# Patient Record
Sex: Male | Born: 1945 | Race: White | Hispanic: No | Marital: Married
Health system: Southern US, Community
[De-identification: ages and names within clinical notes are randomized; demographics above are authoritative.]

## PROBLEM LIST (undated history)

## (undated) DIAGNOSIS — J9621 Acute and chronic respiratory failure with hypoxia: Secondary | ICD-10-CM

## (undated) DIAGNOSIS — J449 Chronic obstructive pulmonary disease, unspecified: Secondary | ICD-10-CM

## (undated) DIAGNOSIS — B20 Human immunodeficiency virus [HIV] disease: Secondary | ICD-10-CM

## (undated) DIAGNOSIS — E785 Hyperlipidemia, unspecified: Secondary | ICD-10-CM

## (undated) DIAGNOSIS — B59 Pneumocystosis: Secondary | ICD-10-CM

## (undated) HISTORY — PX: CATARACT EXTRACTION: SUR2

## (undated) HISTORY — PX: BRONCHOSCOPY: SUR163

## (undated) HISTORY — PX: FOOT SURGERY: SHX648

---

## 2018-05-21 ENCOUNTER — Inpatient Hospital Stay
Admission: AD | Admit: 2018-05-21 | Discharge: 2018-06-23 | Disposition: E | Payer: Medicare Other | Source: Ambulatory Visit | Attending: Internal Medicine | Admitting: Internal Medicine

## 2018-05-21 ENCOUNTER — Other Ambulatory Visit (HOSPITAL_COMMUNITY): Payer: Medicare Other

## 2018-05-21 DIAGNOSIS — J4489 Other specified chronic obstructive pulmonary disease: Secondary | ICD-10-CM | POA: Diagnosis present

## 2018-05-21 DIAGNOSIS — B20 Human immunodeficiency virus [HIV] disease: Secondary | ICD-10-CM | POA: Diagnosis present

## 2018-05-21 DIAGNOSIS — J9621 Acute and chronic respiratory failure with hypoxia: Secondary | ICD-10-CM | POA: Diagnosis present

## 2018-05-21 DIAGNOSIS — Z4659 Encounter for fitting and adjustment of other gastrointestinal appliance and device: Secondary | ICD-10-CM

## 2018-05-21 DIAGNOSIS — J449 Chronic obstructive pulmonary disease, unspecified: Secondary | ICD-10-CM | POA: Diagnosis present

## 2018-05-21 DIAGNOSIS — J9690 Respiratory failure, unspecified, unspecified whether with hypoxia or hypercapnia: Secondary | ICD-10-CM

## 2018-05-21 DIAGNOSIS — Z0189 Encounter for other specified special examinations: Secondary | ICD-10-CM

## 2018-05-21 DIAGNOSIS — B59 Pneumocystosis: Secondary | ICD-10-CM | POA: Diagnosis present

## 2018-05-21 HISTORY — DX: Pneumocystosis: B59

## 2018-05-21 HISTORY — DX: Acute and chronic respiratory failure with hypoxia: J96.21

## 2018-05-21 HISTORY — DX: Chronic obstructive pulmonary disease, unspecified: J44.9

## 2018-05-21 HISTORY — DX: Hyperlipidemia, unspecified: E78.5

## 2018-05-21 HISTORY — DX: Human immunodeficiency virus (HIV) disease: B20

## 2018-05-22 ENCOUNTER — Institutional Professional Consult (permissible substitution) (HOSPITAL_COMMUNITY): Payer: Medicare Other

## 2018-05-22 DIAGNOSIS — J9621 Acute and chronic respiratory failure with hypoxia: Secondary | ICD-10-CM

## 2018-05-22 DIAGNOSIS — J449 Chronic obstructive pulmonary disease, unspecified: Secondary | ICD-10-CM

## 2018-05-22 DIAGNOSIS — B20 Human immunodeficiency virus [HIV] disease: Secondary | ICD-10-CM

## 2018-05-22 DIAGNOSIS — B59 Pneumocystosis: Secondary | ICD-10-CM | POA: Diagnosis not present

## 2018-05-22 LAB — CBC
HEMATOCRIT: 34.2 % — AB (ref 39.0–52.0)
HEMOGLOBIN: 10.6 g/dL — AB (ref 13.0–17.0)
MCH: 28.2 pg (ref 26.0–34.0)
MCHC: 31 g/dL (ref 30.0–36.0)
MCV: 91 fL (ref 78.0–100.0)
PLATELETS: 59 10*3/uL — AB (ref 150–400)
RBC: 3.76 MIL/uL — ABNORMAL LOW (ref 4.22–5.81)
RDW: 16.7 % — ABNORMAL HIGH (ref 11.5–15.5)
WBC: 19.1 10*3/uL — ABNORMAL HIGH (ref 4.0–10.5)

## 2018-05-22 LAB — COMPREHENSIVE METABOLIC PANEL
ALBUMIN: 1.9 g/dL — AB (ref 3.5–5.0)
ALT: 50 U/L — ABNORMAL HIGH (ref 0–44)
ANION GAP: 7 (ref 5–15)
AST: 62 U/L — AB (ref 15–41)
Alkaline Phosphatase: 159 U/L — ABNORMAL HIGH (ref 38–126)
BILIRUBIN TOTAL: 0.6 mg/dL (ref 0.3–1.2)
BUN: 34 mg/dL — AB (ref 8–23)
CALCIUM: 7.4 mg/dL — AB (ref 8.9–10.3)
CO2: 27 mmol/L (ref 22–32)
Chloride: 102 mmol/L (ref 98–111)
Creatinine, Ser: 1.2 mg/dL (ref 0.61–1.24)
GFR calc Af Amer: >60 mL/min (ref >60)
GFR calc non Af Amer: 60 mL/min — ABNORMAL LOW (ref >60)
GLUCOSE: 231 mg/dL — AB (ref 70–99)
POTASSIUM: 5.3 mmol/L — AB (ref 3.5–5.1)
SODIUM: 136 mmol/L (ref 135–145)
TOTAL PROTEIN: 5.5 g/dL — AB (ref 6.5–8.1)

## 2018-05-22 LAB — PROTIME-INR
INR: 1.35
Prothrombin Time: 16.6 seconds — ABNORMAL HIGH (ref 11.4–15.2)

## 2018-05-22 LAB — LACTIC ACID, PLASMA
LACTIC ACID, VENOUS: 3 mmol/L — AB (ref 0.5–1.9)
Lactic Acid, Venous: 3.8 mmol/L (ref 0.5–1.9)

## 2018-05-22 LAB — PHOSPHORUS: PHOSPHORUS: 3.4 mg/dL (ref 2.5–4.6)

## 2018-05-22 LAB — MAGNESIUM: MAGNESIUM: 2.1 mg/dL (ref 1.7–2.4)

## 2018-05-22 NOTE — Consult Note (Signed)
[facility] [place]  [facility]  Date of Service: 05/22/2018  PULMONARY CRITICAL CARE CONSULT   Walter Sanchez  NWG:[id number]  DOB: October 15, 1945   DOA: 10/30/17  Referring Physician: Carron CurieAli Hijazi, MD  HPI: Walter Sanchez is a 72 y.o. male seen for follow up of Acute on Chronic Respiratory Failure.  Patient presented originally to the hospital with history of COPD chronic anemia.  Patient had presented with hemoglobin of 5.5.  Apparently has a history of COPD chronic oxygen dependence and he was seen initially for the low hemoglobin for possibility of EGD or colonoscopy.  In addition he had been having poor appetite and issues with his potassium.  His hospital course was complicated as noted.  The patient initially was admitted with a diffusely abnormal CT scan and there was concern raised whether this might be tuberculosis versus malignancy.  Patient was admitted to isolation room and sputum's were ordered.  Patient had decompensation with increased respiratory distress was intubated on 26 August.  The patient underwent a bronchoscopy and cultures were sent to include PCP fungal BAL as well as CMV and AFB.  Patient was found apparently to have a low CD4 count of 110 his viral load was 646,000 and a positive HIV status.  Patient was transferred to our facility for further management and work-up.  At the time of evaluation he is significantly hypoxic and is on the ventilator.  Patient has diffuse infiltrates noted on the chest film.  Patient is critically ill.  The results of the PCP which was done on August 26 have come back as positive and patient right now is being treated aggressively for pneumocystis.  Review of Systems:  ROS performed and is unremarkable other than noted above.  Past Medical History:  Diagnosis Date  . Acute on chronic respiratory failure with hypoxia (HCC)   . AIDS (acquired immune deficiency syndrome) (HCC)   . HLD  (hyperlipidemia)   . Obstructive chronic bronchitis without exacerbation (HCC)   . PCP (pneumocystis jiroveci pneumonia) (HCC)     Past Surgical History:  Procedure Laterality Date  . BRONCHOSCOPY    . CATARACT EXTRACTION    . FOOT SURGERY      Social History:    has an unknown smoking status. He has never used smokeless tobacco. He reports that he drank alcohol. He reports that he has current or past drug history. Drug: IV.  Family History: Non-Contributory to the present illness  Allergies have been reviewed on the paper chart  Medications: Reviewed on Rounds  Physical Exam:  Vitals: Temperature 98.3 pulse 86 respiratory rate 20 blood pressure 116/70 saturation 94%  Ventilator Settings mode of ventilation assist control FiO2 80% tidal volume 400 PEEP 10  . General: Comfortable at this time . Eyes: Grossly normal lids, irises & conjunctiva . ENT: grossly tongue is normal . Neck: no obvious mass . Cardiovascular: S1-S2 normal no gallop or rub . Respiratory: Few distant rhonchi are noted at this time . Abdomen: Soft nontender . Skin: no rash seen on limited exam . Musculoskeletal: not rigid . Psychiatric:unable to assess . Neurologic: no seizure no involuntary movements         Labs on Admission:  Basic Metabolic Panel: Recent Labs  Lab 05/22/18 0501  NA 136  K 5.3*  CL 102  CO2 27  GLUCOSE 231*  BUN 34*  CREATININE 1.20  CALCIUM 7.4*  MG 2.1  PHOS 3.4    Liver Function Tests: Recent Labs  Lab 05/22/18 0501  AST 62*  ALT 50*  ALKPHOS 159*  BILITOT 0.6  PROT 5.5*  ALBUMIN 1.9*   No results for input(s): LIPASE, AMYLASE in the last 168 hours. No results for input(s): AMMONIA in the last 168 hours.  CBC: Recent Labs  Lab 05/22/18 0501  WBC 19.1*  HGB 10.6*  HCT 34.2*  MCV 91.0  PLT 59*    Cardiac Enzymes: No results for input(s): CKTOTAL, CKMB, CKMBINDEX, TROPONINI in the last 168 hours.  BNP (last 3 results) No results for  input(s): BNP in the last 8760 hours.  ProBNP (last 3 results) No results for input(s): PROBNP in the last 8760 hours.   Radiological Exams on Admission: Dg Chest Port 1 View  Result Date: 06/03/18 CLINICAL DATA:  Respiratory failure. EXAM: PORTABLE CHEST 1 VIEW COMPARISON:  None. FINDINGS: An endotracheal tube is identified with tip 6 cm above the carina. An NG tube enters the stomach with tip off the field of view. A LEFT subclavian central venous catheter is noted with tip overlying the mid SVC. Diffuse bilateral airspace opacities are present. There may be trace bilateral pleural effusions. No acute bony abnormalities are identified. IMPRESSION: Support apparatus as described. Endotracheal tube with tip 6 cm above the carina. Diffuse bilateral airspace opacities, favor edema over infection. Question trace pleural effusions. Electronically Signed   By: Harmon Pier M.D.   On: 06-03-18 21:01    Assessment/Plan Active Problems:   Obstructive chronic bronchitis without exacerbation (HCC)   Acute on chronic respiratory failure with hypoxia (HCC)   PCP (pneumocystis jiroveci pneumonia) (HCC)   AIDS (acquired immune deficiency syndrome) (HCC)   1. Acute on chronic respiratory failure with hypoxia patient has severe hypoxia has been requiring maximal ventilatory support.  Patient right now is on assist control mode I am going to adjust the PEEP to help improve his oxygenation.  Chest x-ray was reviewed shows diffuse infiltrates consistent with PCP.  The PCP has been confirmed via bronchoscopy at the transferring facility.  Patient is currently on trimethoprim sulfamethoxazole.  In addition I reviewed his steroid dosage and we will adjust that according to protocol.  Prognosis is quite poor 2. PCP pneumonia patient is going to be treated with appropriate antibiotics as indicated along with this we will place him on Atrovent steroid therapy.  Had a discussion with the primary care team and steroids  were adjusted according to protocol. 3. AIDS patient was started on therapy will continue with supportive care his CD4 count was 110 with a high viral load patient prognosis is guarded. 4. COPD we will continue with present therapy at this time.  I have personally seen and evaluated the patient, evaluated laboratory and imaging results, formulated the assessment and plan and placed orders. The Patient requires high complexity decision making for assessment and support.  Case was discussed on Rounds with the Respiratory Therapy Staff Time Spent patient is critically ill in danger of cardiac arrest and death.  Patient also has a high risk airway  Yevonne Pax, MD [facility] Pulmonary Critical Care Medicine Sleep Medicine

## 2018-05-23 ENCOUNTER — Encounter: Payer: Self-pay | Admitting: Internal Medicine

## 2018-05-23 DIAGNOSIS — B20 Human immunodeficiency virus [HIV] disease: Secondary | ICD-10-CM | POA: Diagnosis not present

## 2018-05-23 DIAGNOSIS — J449 Chronic obstructive pulmonary disease, unspecified: Secondary | ICD-10-CM | POA: Diagnosis present

## 2018-05-23 DIAGNOSIS — B59 Pneumocystosis: Secondary | ICD-10-CM | POA: Diagnosis not present

## 2018-05-23 DIAGNOSIS — J9621 Acute and chronic respiratory failure with hypoxia: Secondary | ICD-10-CM | POA: Diagnosis not present

## 2018-05-23 LAB — BLOOD GAS, ARTERIAL
ACID-BASE DEFICIT: 0.1 mmol/L (ref 0.0–2.0)
Acid-base deficit: 0.4 mmol/L (ref 0.0–2.0)
BICARBONATE: 25.8 mmol/L (ref 20.0–28.0)
Bicarbonate: 26 mmol/L (ref 20.0–28.0)
FIO2: 85
FIO2: 85
LHR: 30 {breaths}/min
LHR: 35 {breaths}/min
O2 SAT: 96.5 %
O2 Saturation: 94.2 %
PATIENT TEMPERATURE: 98.6
PCO2 ART: 58.8 mmHg — AB (ref 32.0–48.0)
PCO2 ART: 58.6 mmHg — AB (ref 32.0–48.0)
PEEP/CPAP: 7 cmH2O
PEEP: 10 cmH2O
PH ART: 7.265 — AB (ref 7.350–7.450)
PH ART: 7.27 — AB (ref 7.350–7.450)
Patient temperature: 98.6
Pressure control: 20 cmH2O
Pressure control: 18 cmH2O
pO2, Arterial: 82.2 mmHg — ABNORMAL LOW (ref 83.0–108.0)
pO2, Arterial: 98.6 mmHg (ref 83.0–108.0)

## 2018-05-23 LAB — URINALYSIS, ROUTINE W REFLEX MICROSCOPIC
Bilirubin Urine: NEGATIVE
Glucose, UA: 50 mg/dL — AB
Ketones, ur: NEGATIVE mg/dL
Nitrite: NEGATIVE
PROTEIN: 30 mg/dL — AB
Specific Gravity, Urine: 1.015 (ref 1.005–1.030)
WBC, UA: >50 WBC/hpf — ABNORMAL HIGH (ref 0–5)
pH: 5 (ref 5.0–8.0)

## 2018-05-23 LAB — BASIC METABOLIC PANEL
Anion gap: 11 (ref 5–15)
BUN: 38 mg/dL — AB (ref 8–23)
CALCIUM: 7.6 mg/dL — AB (ref 8.9–10.3)
CHLORIDE: 99 mmol/L (ref 98–111)
CO2: 26 mmol/L (ref 22–32)
CREATININE: 1.25 mg/dL — AB (ref 0.61–1.24)
GFR, EST NON AFRICAN AMERICAN: 57 mL/min — AB (ref >60)
Glucose, Bld: 240 mg/dL — ABNORMAL HIGH (ref 70–99)
Potassium: 5.1 mmol/L (ref 3.5–5.1)
SODIUM: 136 mmol/L (ref 135–145)

## 2018-05-23 LAB — LACTIC ACID, PLASMA
LACTIC ACID, VENOUS: 4.3 mmol/L — AB (ref 0.5–1.9)
LACTIC ACID, VENOUS: 3.5 mmol/L — AB (ref 0.5–1.9)

## 2018-05-23 MED ORDER — FERROUS SULFATE 300 (60 FE) MG/5ML PO SYRP
300.00 | ORAL_SOLUTION | ORAL | Status: DC
Start: 2018-05-22 — End: 2018-05-23

## 2018-05-23 MED ORDER — GENERIC EXTERNAL MEDICATION
10.00 | Status: DC
Start: ? — End: 2018-05-23

## 2018-05-23 MED ORDER — DOCUSATE SODIUM 100 MG PO CAPS
100.00 | ORAL_CAPSULE | ORAL | Status: DC
Start: ? — End: 2018-05-23

## 2018-05-23 MED ORDER — GENERIC EXTERNAL MEDICATION
Status: DC
Start: ? — End: 2018-05-23

## 2018-05-23 MED ORDER — GENERIC EXTERNAL MEDICATION
600.00 | Status: DC
Start: 2018-05-22 — End: 2018-05-23

## 2018-05-23 MED ORDER — METHYLPREDNISOLONE SODIUM SUCC 125 MG IJ SOLR
60.00 | INTRAMUSCULAR | Status: DC
Start: 2018-05-22 — End: 2018-05-23

## 2018-05-23 MED ORDER — GENERIC EXTERNAL MEDICATION
0.40 | Status: DC
Start: ? — End: 2018-05-23

## 2018-05-23 MED ORDER — BICTEGRAVIR-EMTRICITAB-TENOFOV 50-200-25 MG PO TABS
1.00 | ORAL_TABLET | ORAL | Status: DC
Start: 2018-05-22 — End: 2018-05-23

## 2018-05-23 MED ORDER — LORAZEPAM 2 MG/ML IJ SOLN
2.00 | INTRAMUSCULAR | Status: DC
Start: ? — End: 2018-05-23

## 2018-05-23 MED ORDER — HEPARIN SODIUM (PORCINE) 5000 UNIT/ML IJ SOLN
5000.00 | INTRAMUSCULAR | Status: DC
Start: 2018-05-21 — End: 2018-05-23

## 2018-05-23 MED ORDER — ALUMINUM-MAGNESIUM-SIMETHICONE 200-200-20 MG/5ML PO SUSP
30.00 | ORAL | Status: DC
Start: ? — End: 2018-05-23

## 2018-05-23 MED ORDER — FAMOTIDINE 20 MG/2ML IV SOLN
20.00 | INTRAVENOUS | Status: DC
Start: 2018-05-21 — End: 2018-05-23

## 2018-05-23 MED ORDER — HYDROCODONE-ACETAMINOPHEN 7.5-325 MG/15ML PO SOLN
10.00 | ORAL | Status: DC
Start: ? — End: 2018-05-23

## 2018-05-23 MED ORDER — ACETAMINOPHEN 650 MG RE SUPP
650.00 | RECTAL | Status: DC
Start: ? — End: 2018-05-23

## 2018-05-23 MED ORDER — GENERIC EXTERNAL MEDICATION
0.00 | Status: DC
Start: ? — End: 2018-05-23

## 2018-05-23 MED ORDER — GLUCOSE 40 % PO GEL
15.00 g | ORAL | Status: DC
Start: ? — End: 2018-05-23

## 2018-05-23 MED ORDER — GENERIC EXTERNAL MEDICATION
315.00 | Status: DC
Start: 2018-05-21 — End: 2018-05-23

## 2018-05-23 MED ORDER — ALBUTEROL SULFATE HFA 108 (90 BASE) MCG/ACT IN AERS
2.00 | INHALATION_SPRAY | RESPIRATORY_TRACT | Status: DC
Start: ? — End: 2018-05-23

## 2018-05-23 MED ORDER — GENERIC EXTERNAL MEDICATION
1.00 | Status: DC
Start: ? — End: 2018-05-23

## 2018-05-23 MED ORDER — DEXTROSE 10 % IV SOLN
125.00 | INTRAVENOUS | Status: DC
Start: ? — End: 2018-05-23

## 2018-05-23 MED ORDER — ALBUTEROL SULFATE HFA 108 (90 BASE) MCG/ACT IN AERS
2.00 | INHALATION_SPRAY | RESPIRATORY_TRACT | Status: DC
Start: 2018-05-21 — End: 2018-05-23

## 2018-05-23 MED ORDER — ONDANSETRON HCL 4 MG/2ML IJ SOLN
4.00 | INTRAMUSCULAR | Status: DC
Start: ? — End: 2018-05-23

## 2018-05-23 MED ORDER — INSULIN LISPRO 100 UNIT/ML ~~LOC~~ SOLN
0.00 | SUBCUTANEOUS | Status: DC
Start: 2018-05-21 — End: 2018-05-23

## 2018-05-23 MED ORDER — ACETAMINOPHEN 325 MG PO TABS
650.00 | ORAL_TABLET | ORAL | Status: DC
Start: ? — End: 2018-05-23

## 2018-05-23 MED ORDER — TEMAZEPAM 15 MG PO CAPS
15.00 | ORAL_CAPSULE | ORAL | Status: DC
Start: ? — End: 2018-05-23

## 2018-05-23 MED ORDER — GENERIC EXTERNAL MEDICATION
250.00 | Status: DC
Start: 2018-05-21 — End: 2018-05-23

## 2018-05-23 NOTE — Progress Notes (Signed)
Pulmonary Critical Care Medicine [facility] [place]   PULMONARY CRITICAL CARE SERVICE  PROGRESS NOTE  Date of Service: 05/23/2018  Walter Sanchez  ZOX:[id number]  DOB: 15-Aug-1946   DOA: 04/30/2018  Referring Physician: Carron Curie, MD  HPI: Walter Sanchez is a 72 y.o. male seen for follow up of Acute on Chronic Respiratory Failure.  Patient remains critically ill he had to be changed over to pressure assist control mode currently is on 85% oxygen.  Also ask for a blood gas to be done during rounds which shows that he is still significantly acidotic.  Has been on Precedex Levophed as well as fentanyl.  Medications: Reviewed on Rounds  Physical Exam:  Vitals: Temperature 98.5 pulse 90 respiratory rate 22 blood pressure 127/69 saturations 98%  Ventilator Settings mode of ventilation pressure assist control FiO2 85% tidal volume 606 PEEP is at 10  . General: Comfortable at this time . Eyes: Grossly normal lids, irises & conjunctiva . ENT: grossly tongue is normal . Neck: no obvious mass . Cardiovascular: S1 S2 normal no gallop . Respiratory: Coarse breath sounds noted bilaterally . Abdomen: soft . Skin: no rash seen on limited exam . Musculoskeletal: not rigid . Psychiatric:unable to assess . Neurologic: no seizure no involuntary movements         Lab Data:   Basic Metabolic Panel: Recent Labs  Lab 05/22/18 0501 05/23/18 0023  NA 136 136  K 5.3* 5.1  CL 102 99  CO2 27 26  GLUCOSE 231* 240*  BUN 34* 38*  CREATININE 1.20 1.25*  CALCIUM 7.4* 7.6*  MG 2.1  --   PHOS 3.4  --     Liver Function Tests: Recent Labs  Lab 05/22/18 0501  AST 62*  ALT 50*  ALKPHOS 159*  BILITOT 0.6  PROT 5.5*  ALBUMIN 1.9*   No results for input(s): LIPASE, AMYLASE in the last 168 hours. No results for input(s): AMMONIA in the last 168 hours.  CBC: Recent Labs  Lab 05/22/18 0501  WBC 19.1*  HGB 10.6*  HCT 34.2*  MCV 91.0  PLT 59*    Cardiac Enzymes: No results  for input(s): CKTOTAL, CKMB, CKMBINDEX, TROPONINI in the last 168 hours.  BNP (last 3 results) No results for input(s): BNP in the last 8760 hours.  ProBNP (last 3 results) No results for input(s): PROBNP in the last 8760 hours.  Radiological Exams: Dg Chest Port 1 View  Result Date: 05/23/2018 CLINICAL DATA:  Respiratory failure. EXAM: PORTABLE CHEST 1 VIEW COMPARISON:  None. FINDINGS: An endotracheal tube is identified with tip 6 cm above the carina. An NG tube enters the stomach with tip off the field of view. A LEFT subclavian central venous catheter is noted with tip overlying the mid SVC. Diffuse bilateral airspace opacities are present. There may be trace bilateral pleural effusions. No acute bony abnormalities are identified. IMPRESSION: Support apparatus as described. Endotracheal tube with tip 6 cm above the carina. Diffuse bilateral airspace opacities, favor edema over infection. Question trace pleural effusions. Electronically Signed   By: Harmon Pier M.D.   On: 04/29/2018 21:01   Dg Abd Portable 1v  Result Date: 05/22/2018 CLINICAL DATA:  Check nasogastric catheter placement EXAM: PORTABLE ABDOMEN - 1 VIEW COMPARISON:  None. FINDINGS: Scattered large and small bowel gas is noted. Nasogastric catheter is noted within the distal stomach likely extending into the proximal duodenum. IMPRESSION: NG tube as described. Electronically Signed   By: Alcide Clever M.D.   On: 05/22/2018 12:52  Assessment/Plan Active Problems:   Obstructive chronic bronchitis without exacerbation (HCC)   Acute on chronic respiratory failure with hypoxia (HCC)   PCP (pneumocystis jiroveci pneumonia) (HCC)   AIDS (acquired immune deficiency syndrome) (HCC)   1. Acute on chronic respiratory failure with hypoxia patient remains critically ill on the ventilator as noted patient now has to have increased ventilatory support with 85% FiO2 in pressure control mode.  We will titrate the PEEP and titrate the oxygen.   Blood gas was ordered we will follow-up the results.  Follow-up chest x-ray as necessary.  Continue with aggressive therapy for underlying pneumocystis pneumonia. 2. PCP pneumonia patient is currently on appropriate antibiotic therapy.  I spoke with the primary care team and since there is not been much improvement with appropriate therapy recommended that we broaden the coverage to include bacterial pathogens in this situation.  Patient will be continued on vancomycin we will add Zosyn to the regimen. 3. AIDS HIV patient will be continued on present management. 4. COPD stable at this point   I have personally seen and evaluated the patient, evaluated laboratory and imaging results, formulated the assessment and plan and placed orders. The Patient requires high complexity decision making for assessment and support.  Case was discussed on Rounds with the Respiratory Therapy Staff time spent 35 minutes patient is critically ill in danger of cardiac arrest we will continue with ongoing management titration of drips patient has high risk airway  Yevonne PaxSaadat A Khan, MD [facility] Pulmonary Critical Care Medicine Sleep Medicine

## 2018-05-24 LAB — BLOOD CULTURE ID PANEL (REFLEXED)
ACINETOBACTER BAUMANNII: NOT DETECTED
CANDIDA ALBICANS: DETECTED — AB
CANDIDA GLABRATA: NOT DETECTED
CANDIDA KRUSEI: NOT DETECTED
Candida parapsilosis: NOT DETECTED
Candida tropicalis: NOT DETECTED
ENTEROBACTER CLOACAE COMPLEX: NOT DETECTED
ENTEROBACTERIACEAE SPECIES: NOT DETECTED
ESCHERICHIA COLI: NOT DETECTED
Enterococcus species: NOT DETECTED
Haemophilus influenzae: NOT DETECTED
KLEBSIELLA PNEUMONIAE: NOT DETECTED
Klebsiella oxytoca: NOT DETECTED
Listeria monocytogenes: NOT DETECTED
NEISSERIA MENINGITIDIS: NOT DETECTED
PSEUDOMONAS AERUGINOSA: NOT DETECTED
Proteus species: NOT DETECTED
STREPTOCOCCUS AGALACTIAE: NOT DETECTED
STREPTOCOCCUS PNEUMONIAE: NOT DETECTED
Serratia marcescens: NOT DETECTED
Staphylococcus aureus (BCID): NOT DETECTED
Staphylococcus species: NOT DETECTED
Streptococcus pyogenes: NOT DETECTED
Streptococcus species: NOT DETECTED

## 2018-05-24 DEATH — deceased

## 2018-05-26 LAB — CULTURE, BLOOD (ROUTINE X 2)
SPECIAL REQUESTS: ADEQUATE
Special Requests: ADEQUATE

## 2018-06-23 DEATH — deceased

## 2019-10-25 IMAGING — DX DG CHEST 1V PORT
1 series · 1 of 1 positions shown · non-contrast
Comparison: None.

CLINICAL DATA: Respiratory failure.

EXAM:
PORTABLE CHEST 1 VIEW

[chest ap]
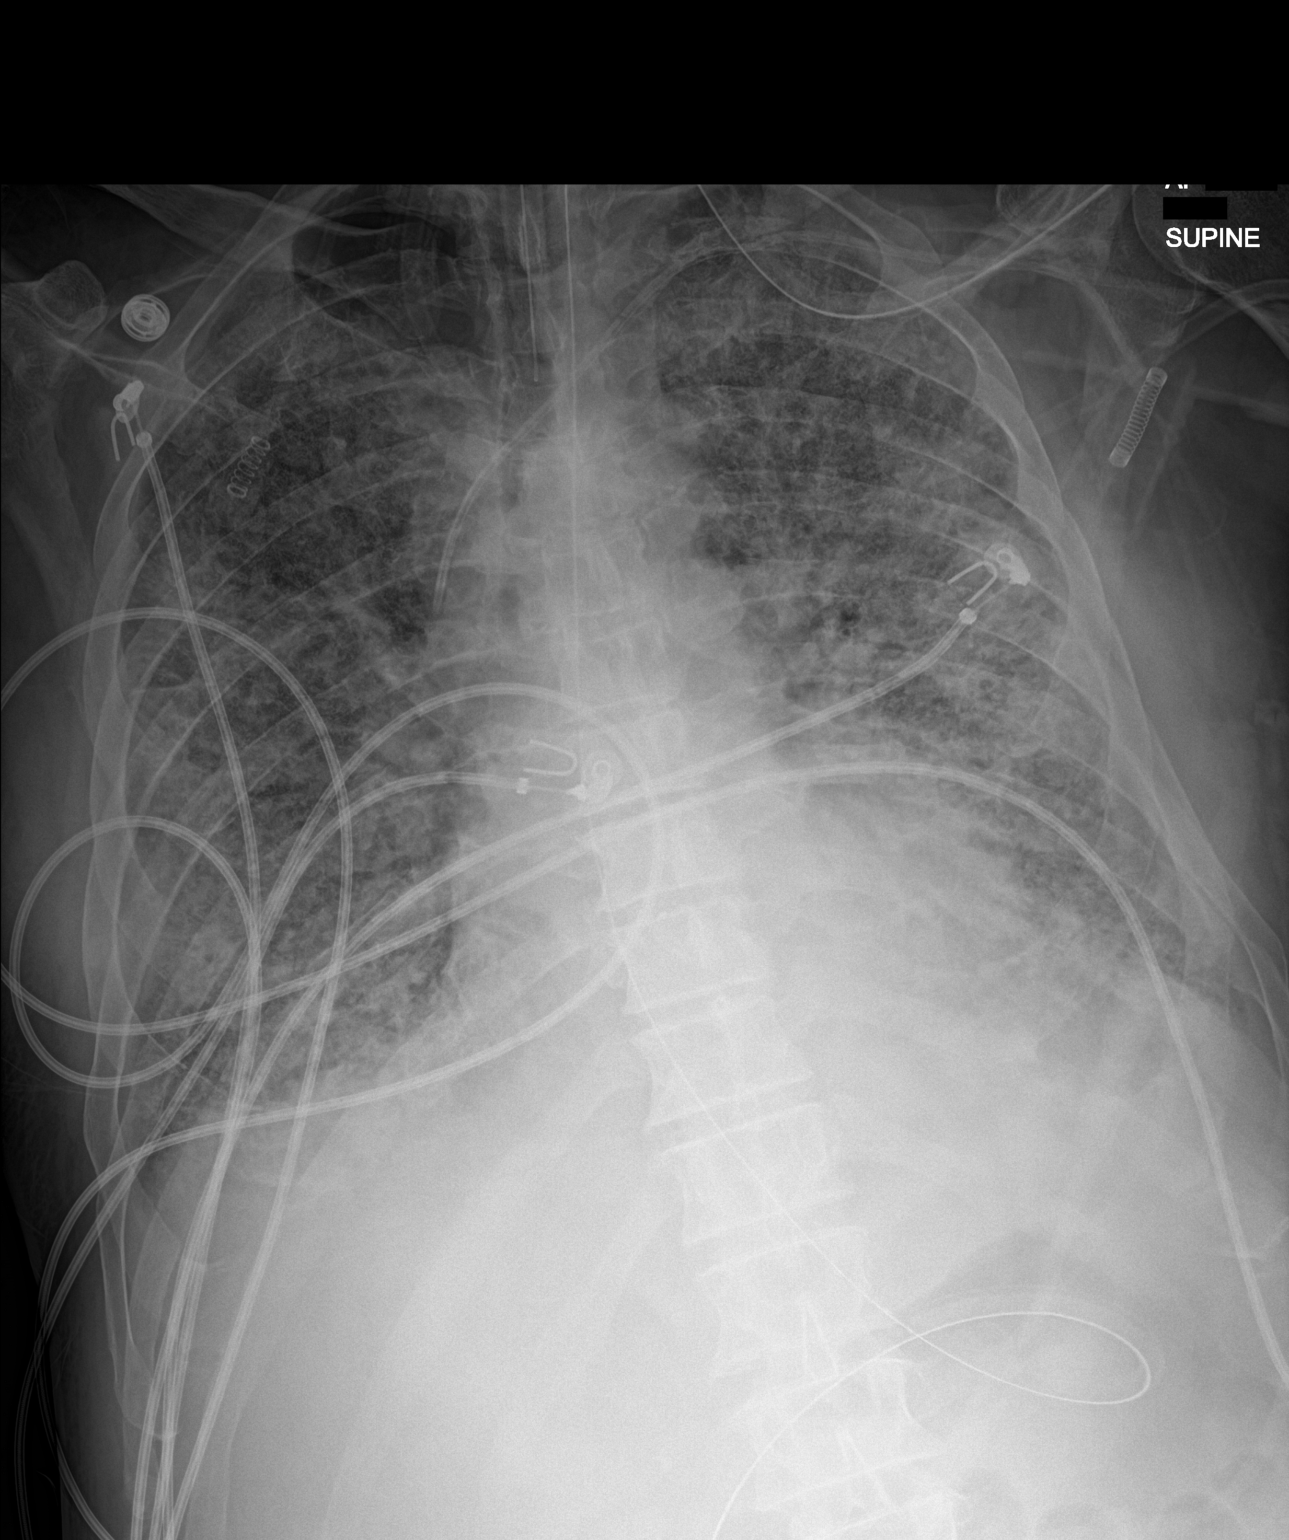

[1 of 1 positions shown; findings below may reference images not displayed]

FINDINGS: An endotracheal tube is identified with tip 6 cm above the carina.

An NG tube enters the stomach with tip off the field of view.

A LEFT subclavian central venous catheter is noted with tip
overlying the mid SVC.

Diffuse bilateral airspace opacities are present.

There may be trace bilateral pleural effusions.

No acute bony abnormalities are identified.
IMPRESSION: Support apparatus as described. Endotracheal tube with tip 6 cm
above the carina.

Diffuse bilateral airspace opacities, favor edema over infection.
Question trace pleural effusions.
